# Patient Record
Sex: Female | Born: 1950 | Race: Black or African American | Hispanic: No | Marital: Married | Smoking: Never smoker
Health system: Southern US, Community
[De-identification: ages and names within clinical notes are randomized; demographics above are authoritative.]

## PROBLEM LIST (undated history)

## (undated) DIAGNOSIS — M109 Gout, unspecified: Secondary | ICD-10-CM

## (undated) DIAGNOSIS — I10 Essential (primary) hypertension: Secondary | ICD-10-CM

## (undated) DIAGNOSIS — L930 Discoid lupus erythematosus: Secondary | ICD-10-CM

## (undated) DIAGNOSIS — R7303 Prediabetes: Secondary | ICD-10-CM

## (undated) DIAGNOSIS — I7300 Raynaud's syndrome without gangrene: Secondary | ICD-10-CM

## (undated) HISTORY — PX: APPENDECTOMY: SHX54

## (undated) HISTORY — PX: TONSILLECTOMY: SUR1361

## (undated) HISTORY — PX: HERNIA REPAIR: SHX51

---

## 2014-08-21 DIAGNOSIS — L508 Other urticaria: Secondary | ICD-10-CM | POA: Insufficient documentation

## 2014-08-21 DIAGNOSIS — E559 Vitamin D deficiency, unspecified: Secondary | ICD-10-CM | POA: Insufficient documentation

## 2014-08-21 DIAGNOSIS — I10 Essential (primary) hypertension: Secondary | ICD-10-CM | POA: Insufficient documentation

## 2014-09-25 DIAGNOSIS — Z531 Procedure and treatment not carried out because of patient's decision for reasons of belief and group pressure: Secondary | ICD-10-CM | POA: Insufficient documentation

## 2014-10-19 DIAGNOSIS — J3089 Other allergic rhinitis: Secondary | ICD-10-CM | POA: Insufficient documentation

## 2015-02-02 DIAGNOSIS — R0683 Snoring: Secondary | ICD-10-CM | POA: Insufficient documentation

## 2015-03-04 ENCOUNTER — Ambulatory Visit: Payer: BLUE CROSS/BLUE SHIELD | Attending: Otolaryngology

## 2015-03-04 DIAGNOSIS — R0683 Snoring: Secondary | ICD-10-CM | POA: Insufficient documentation

## 2015-03-04 DIAGNOSIS — G4733 Obstructive sleep apnea (adult) (pediatric): Secondary | ICD-10-CM | POA: Diagnosis not present

## 2015-04-08 ENCOUNTER — Ambulatory Visit: Payer: BLUE CROSS/BLUE SHIELD | Attending: Otolaryngology

## 2015-04-08 DIAGNOSIS — R0683 Snoring: Secondary | ICD-10-CM | POA: Diagnosis present

## 2015-04-08 DIAGNOSIS — G4733 Obstructive sleep apnea (adult) (pediatric): Secondary | ICD-10-CM | POA: Insufficient documentation

## 2015-06-21 DIAGNOSIS — Z6834 Body mass index (BMI) 34.0-34.9, adult: Secondary | ICD-10-CM | POA: Insufficient documentation

## 2015-06-21 DIAGNOSIS — G4733 Obstructive sleep apnea (adult) (pediatric): Secondary | ICD-10-CM | POA: Insufficient documentation

## 2015-09-14 DIAGNOSIS — Z780 Asymptomatic menopausal state: Secondary | ICD-10-CM | POA: Insufficient documentation

## 2015-11-01 ENCOUNTER — Other Ambulatory Visit: Payer: Self-pay | Admitting: Internal Medicine

## 2015-11-01 DIAGNOSIS — Z1231 Encounter for screening mammogram for malignant neoplasm of breast: Secondary | ICD-10-CM

## 2015-11-08 ENCOUNTER — Ambulatory Visit
Admission: RE | Admit: 2015-11-08 | Discharge: 2015-11-08 | Disposition: A | Discharge status: Home / Self Care | Payer: Medicare Other | Source: Ambulatory Visit | Attending: Internal Medicine | Admitting: Internal Medicine

## 2015-11-08 DIAGNOSIS — Z1231 Encounter for screening mammogram for malignant neoplasm of breast: Secondary | ICD-10-CM | POA: Insufficient documentation

## 2016-01-17 DIAGNOSIS — R7303 Prediabetes: Secondary | ICD-10-CM | POA: Insufficient documentation

## 2016-09-11 ENCOUNTER — Other Ambulatory Visit: Payer: Self-pay | Admitting: Internal Medicine

## 2016-09-11 DIAGNOSIS — Z1231 Encounter for screening mammogram for malignant neoplasm of breast: Secondary | ICD-10-CM

## 2016-11-09 ENCOUNTER — Ambulatory Visit: Payer: BLUE CROSS/BLUE SHIELD

## 2016-11-10 ENCOUNTER — Ambulatory Visit
Admission: RE | Admit: 2016-11-10 | Discharge: 2016-11-10 | Disposition: A | Discharge status: Home / Self Care | Payer: Medicare Other | Source: Ambulatory Visit | Attending: Internal Medicine | Admitting: Internal Medicine

## 2016-11-10 DIAGNOSIS — Z1231 Encounter for screening mammogram for malignant neoplasm of breast: Secondary | ICD-10-CM

## 2017-12-17 ENCOUNTER — Other Ambulatory Visit: Payer: Self-pay | Admitting: Internal Medicine

## 2017-12-17 DIAGNOSIS — Z1231 Encounter for screening mammogram for malignant neoplasm of breast: Secondary | ICD-10-CM

## 2017-12-24 ENCOUNTER — Ambulatory Visit
Admission: RE | Admit: 2017-12-24 | Discharge: 2017-12-24 | Disposition: A | Discharge status: Home / Self Care | Payer: Medicare Other | Source: Ambulatory Visit | Attending: Internal Medicine | Admitting: Internal Medicine

## 2017-12-24 DIAGNOSIS — Z1231 Encounter for screening mammogram for malignant neoplasm of breast: Secondary | ICD-10-CM | POA: Diagnosis not present

## 2018-07-05 DIAGNOSIS — R3129 Other microscopic hematuria: Secondary | ICD-10-CM | POA: Insufficient documentation

## 2018-12-19 ENCOUNTER — Other Ambulatory Visit: Payer: Self-pay | Admitting: Internal Medicine

## 2018-12-19 DIAGNOSIS — Z1231 Encounter for screening mammogram for malignant neoplasm of breast: Secondary | ICD-10-CM

## 2019-07-23 ENCOUNTER — Other Ambulatory Visit: Payer: Self-pay | Admitting: Infectious Diseases

## 2019-07-23 DIAGNOSIS — Z1231 Encounter for screening mammogram for malignant neoplasm of breast: Secondary | ICD-10-CM

## 2019-08-25 ENCOUNTER — Other Ambulatory Visit: Payer: Self-pay

## 2019-08-25 DIAGNOSIS — Z20822 Contact with and (suspected) exposure to covid-19: Secondary | ICD-10-CM

## 2019-08-27 LAB — NOVEL CORONAVIRUS, NAA: SARS-CoV-2, NAA: NOT DETECTED

## 2019-10-20 ENCOUNTER — Ambulatory Visit
Admission: RE | Admit: 2019-10-20 | Discharge: 2019-10-20 | Disposition: A | Discharge status: Home / Self Care | Payer: Medicare Other | Source: Ambulatory Visit | Attending: Infectious Diseases | Admitting: Infectious Diseases

## 2019-10-20 DIAGNOSIS — Z1231 Encounter for screening mammogram for malignant neoplasm of breast: Secondary | ICD-10-CM | POA: Diagnosis present

## 2020-09-20 ENCOUNTER — Other Ambulatory Visit: Payer: Self-pay | Admitting: Infectious Diseases

## 2020-09-20 DIAGNOSIS — Z1231 Encounter for screening mammogram for malignant neoplasm of breast: Secondary | ICD-10-CM

## 2020-11-02 ENCOUNTER — Other Ambulatory Visit: Payer: Self-pay

## 2020-11-02 ENCOUNTER — Ambulatory Visit
Admission: RE | Admit: 2020-11-02 | Discharge: 2020-11-02 | Disposition: A | Discharge status: Home / Self Care | Payer: Medicare Other | Source: Ambulatory Visit | Attending: Infectious Diseases | Admitting: Infectious Diseases

## 2020-11-02 DIAGNOSIS — Z1231 Encounter for screening mammogram for malignant neoplasm of breast: Secondary | ICD-10-CM | POA: Diagnosis not present

## 2020-11-10 ENCOUNTER — Ambulatory Visit: Payer: Medicare Other | Admitting: Podiatry

## 2020-11-22 ENCOUNTER — Ambulatory Visit: Payer: Medicare Other | Admitting: Podiatry

## 2020-12-20 ENCOUNTER — Ambulatory Visit (INDEPENDENT_AMBULATORY_CARE_PROVIDER_SITE_OTHER): Payer: Medicare Other | Admitting: Podiatry

## 2020-12-20 ENCOUNTER — Encounter: Payer: Self-pay | Admitting: Podiatry

## 2020-12-20 ENCOUNTER — Other Ambulatory Visit: Payer: Self-pay

## 2020-12-20 DIAGNOSIS — B351 Tinea unguium: Secondary | ICD-10-CM

## 2020-12-20 DIAGNOSIS — D2371 Other benign neoplasm of skin of right lower limb, including hip: Secondary | ICD-10-CM

## 2020-12-20 DIAGNOSIS — D2372 Other benign neoplasm of skin of left lower limb, including hip: Secondary | ICD-10-CM

## 2020-12-20 DIAGNOSIS — M79676 Pain in unspecified toe(s): Secondary | ICD-10-CM | POA: Diagnosis not present

## 2020-12-20 NOTE — Progress Notes (Signed)
  Subjective:  Patient ID: Regina Frazier, female    DOB: Jul 11, 1951,  MRN: [id number] HPI Chief Complaint  Patient presents with  . Diabetes    Pre-diabetic - check feet - requesting nail care, callused areas, corns   . New Patient (Initial Visit)    70 y.o. female presents with the above complaint.   ROS: Denies fever chills nausea vomiting muscle aches pains calf pain back pain chest pain shortness of breath  History of Raynaud's hammertoe repair over 20 years ago bilateral  No past medical history on file. No past surgical history on file.  Current Outpatient Medications:  .  hydrochlorothiazide (HYDRODIURIL) 25 MG tablet, Take 25 mg by mouth daily., Disp: , Rfl:  .  levocetirizine (XYZAL) 5 MG tablet, SMARTSIG:1 Tablet(s) By Mouth Every Evening, Disp: , Rfl:   Allergies  Allergen Reactions  . Clindamycin     Other reaction(s): Other (See Comments) GI upset  . Penicillins     Other reaction(s): Other (See Comments), Other (See Comments) unsure As a child, GI upset   . Aspirin Nausea Only    Other reaction(s): Other (See Comments) GI upset   . Latex Rash   Review of Systems Objective:  There were no vitals filed for this visit.  General: Well developed, nourished, in no acute distress, alert and oriented x3   Dermatological: Skin is warm, dry and supple bilateral. Nails x 10 are well maintained; remaining integument appears unremarkable at this time. There are no open sores, no preulcerative lesions, no rash or signs of infection present.  She has areas of xerosis and areas of multiple benign hyperkeratotic lesions.  Vascular: Dorsalis Pedis artery and Posterior Tibial artery pedal pulses are 2/4 bilateral with immedate capillary fill time. Pedal hair growth present. No varicosities and no lower extremity edema present bilateral.   Neruologic: Grossly intact via light touch bilateral. Vibratory intact via tuning fork bilateral. Protective threshold with Semmes  Wienstein monofilament intact to all pedal sites bilateral. Patellar and Achilles deep tendon reflexes 2+ bilateral. No Babinski or clonus noted bilateral.   Musculoskeletal: No gross boney pedal deformities bilateral. No pain, crepitus, or limitation noted with foot and ankle range of motion bilateral. Muscular strength 5/5 in all groups tested bilateral.  Gait: Unassisted, Nonantalgic.    Radiographs:  None taken  Assessment & Plan:   Assessment: Hammertoes 2 through 5 bilateral benign skin lesions bilateral diabetes with early diabetic peripheral neuropathy and painful mycotic nails.  Plan: Debridement of toenails 1 through 5 bilateral.  Debridement of all reactive hyperkeratosis bilateral.  Perform diabetic exam.  She will follow-up with Dr. Sharyon Cable for routine nail debridement.     Ahmaud Duthie T. Bell, [place]

## 2021-03-28 ENCOUNTER — Other Ambulatory Visit: Payer: Self-pay

## 2021-03-28 ENCOUNTER — Encounter: Payer: Self-pay | Admitting: Podiatry

## 2021-03-28 ENCOUNTER — Ambulatory Visit (INDEPENDENT_AMBULATORY_CARE_PROVIDER_SITE_OTHER): Payer: Medicare Other | Admitting: Podiatry

## 2021-03-28 DIAGNOSIS — B351 Tinea unguium: Secondary | ICD-10-CM | POA: Diagnosis not present

## 2021-03-28 DIAGNOSIS — L84 Corns and callosities: Secondary | ICD-10-CM | POA: Diagnosis not present

## 2021-03-28 DIAGNOSIS — M2010 Hallux valgus (acquired), unspecified foot: Secondary | ICD-10-CM

## 2021-03-28 DIAGNOSIS — M79676 Pain in unspecified toe(s): Secondary | ICD-10-CM

## 2021-03-28 DIAGNOSIS — M2042 Other hammer toe(s) (acquired), left foot: Secondary | ICD-10-CM

## 2021-03-28 NOTE — Progress Notes (Signed)
This patient presents to the office with chief complaint of long thick painful nails.  Patient says the nails are painful walking and wearing shoes.  This patient also has painful callus both feet expecially her second toe left foot. This patient is unable to trim her nails since she is unable to reach her nails.  She has history of gout both big toes which are improving. She presents to the office for preventative foot care services.  General Appearance  Alert, conversant and in no acute stress.  Vascular  Dorsalis pedis and posterior tibial  pulses are palpable  bilaterally.  Capillary return is within normal limits  bilaterally. Temperature is within normal limits  bilaterally.  Neurologic  Senn-Weinstein monofilament wire test within normal limits  bilaterally. Muscle power within normal limits bilaterally.  Nails Thick disfigured discolored nails with subungual debris  from hallux to fifth toes bilaterally. No evidence of bacterial infection or drainage bilaterally.  Orthopedic  No limitations of motion  feet .  No crepitus or effusions noted.  HAV  B/L.  Hammer toe second toe left foot.  Skin  normotropic skin with noted bilaterally.  No signs of infections or ulcers noted.   Multiple callus sub metatarsals.  Corn second toe left foot due to severe 1/2 positioning.  Onychomycosis  Nails  B/L.  Pain in right toes  Pain in left toes  Corn second toe left foot.  Debridement of nails both feet followed trimming the nails with dremel tool.  Debride corn with # 15 blade.  RTC 3 months.   Gardiner Barefoot DPM

## 2021-07-14 ENCOUNTER — Ambulatory Visit: Payer: Medicare Other | Admitting: Podiatry

## 2021-11-07 ENCOUNTER — Encounter: Payer: Self-pay | Admitting: Emergency Medicine

## 2021-11-07 ENCOUNTER — Other Ambulatory Visit: Payer: Self-pay

## 2021-11-07 ENCOUNTER — Ambulatory Visit
Admission: EM | Admit: 2021-11-07 | Discharge: 2021-11-07 | Disposition: A | Discharge status: Home / Self Care | Payer: Medicare Other | Attending: Physician Assistant | Admitting: Physician Assistant

## 2021-11-07 DIAGNOSIS — Z1152 Encounter for screening for COVID-19: Secondary | ICD-10-CM | POA: Diagnosis not present

## 2021-11-07 NOTE — ED Triage Notes (Addendum)
Pt her for COVID test due to exposure. No sxs

## 2021-11-08 LAB — SARS-COV-2, NAA 2 DAY TAT

## 2021-11-08 LAB — NOVEL CORONAVIRUS, NAA: SARS-CoV-2, NAA: NOT DETECTED

## 2021-12-01 ENCOUNTER — Other Ambulatory Visit: Payer: Self-pay | Admitting: Infectious Diseases

## 2021-12-01 DIAGNOSIS — Z1231 Encounter for screening mammogram for malignant neoplasm of breast: Secondary | ICD-10-CM

## 2022-01-19 ENCOUNTER — Ambulatory Visit: Payer: Medicare Other

## 2022-01-25 ENCOUNTER — Ambulatory Visit
Admission: RE | Admit: 2022-01-25 | Discharge: 2022-01-25 | Disposition: A | Discharge status: Home / Self Care | Payer: Medicare Other | Source: Ambulatory Visit | Attending: Infectious Diseases | Admitting: Infectious Diseases

## 2022-01-25 DIAGNOSIS — Z1231 Encounter for screening mammogram for malignant neoplasm of breast: Secondary | ICD-10-CM | POA: Insufficient documentation

## 2022-08-17 ENCOUNTER — Ambulatory Visit: Payer: Medicare Other

## 2022-10-09 IMAGING — MG MM DIGITAL SCREENING BILAT W/ TOMO AND CAD
8 series · 8 of 24 positions shown · non-contrast
Comparison: Previous exam(s).

CLINICAL DATA: Screening.

EXAM:
DIGITAL SCREENING BILATERAL MAMMOGRAM WITH TOMOSYNTHESIS AND CAD
TECHNIQUE: Bilateral screening digital craniocaudal and mediolateral oblique
mammograms were obtained. Bilateral screening digital breast
tomosynthesis was performed. The images were evaluated with
computer-aided detection.

[R CC synth-2D]
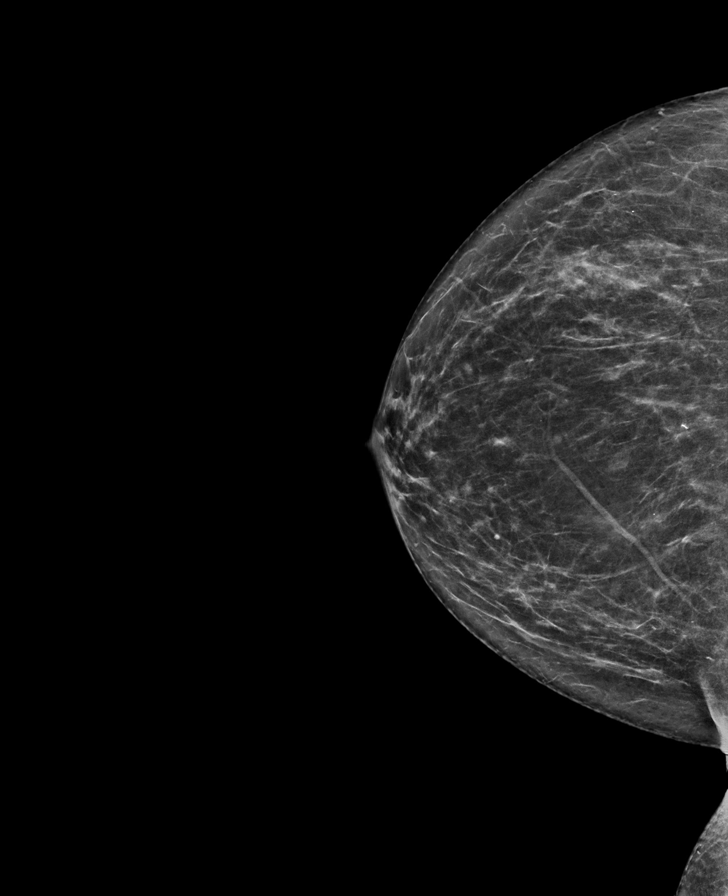

[L MLO synth-2D]
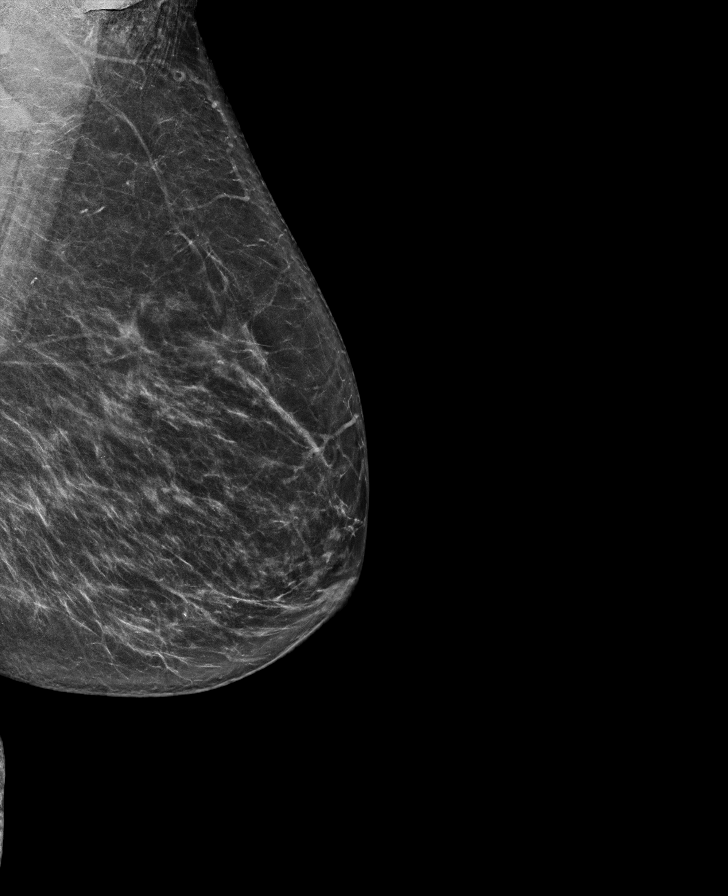

[L CC synth-2D]
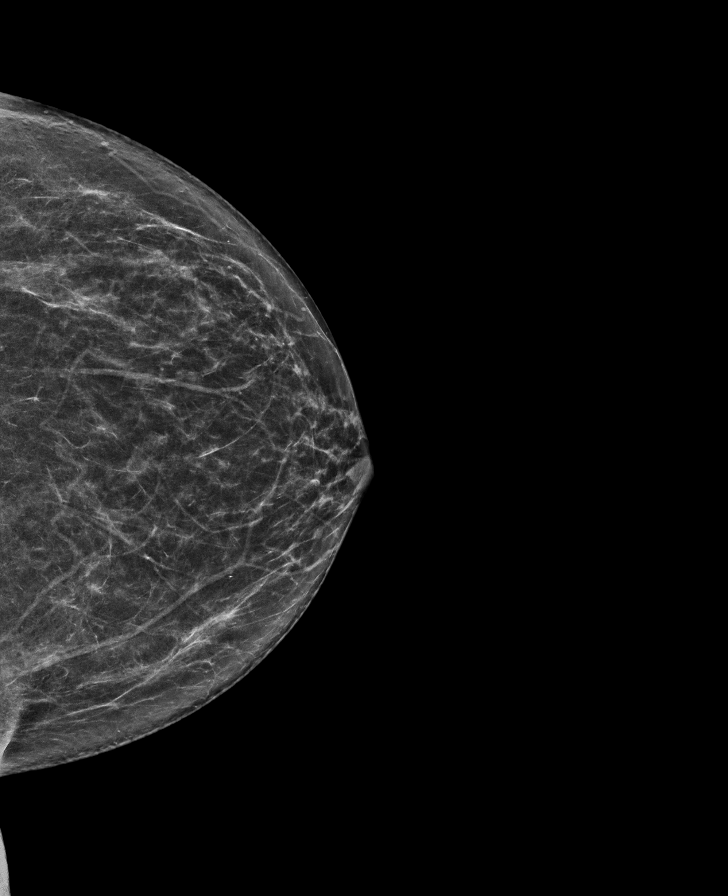

[R MLO synth-2D]
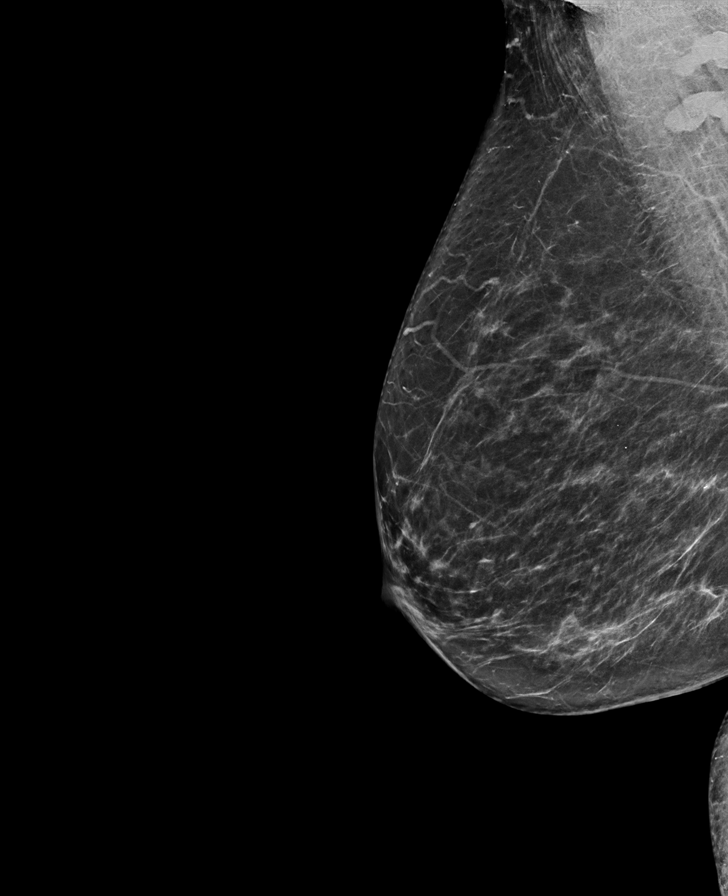

[L CC tomo · tomo slice 35/69.0]
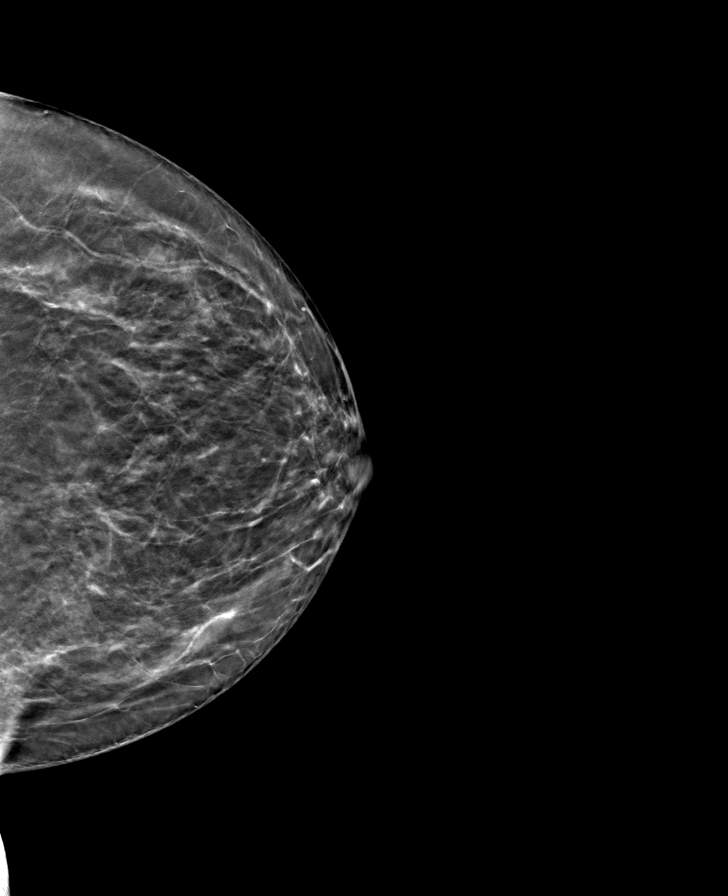

[R CC tomo · tomo slice 36/71.0]
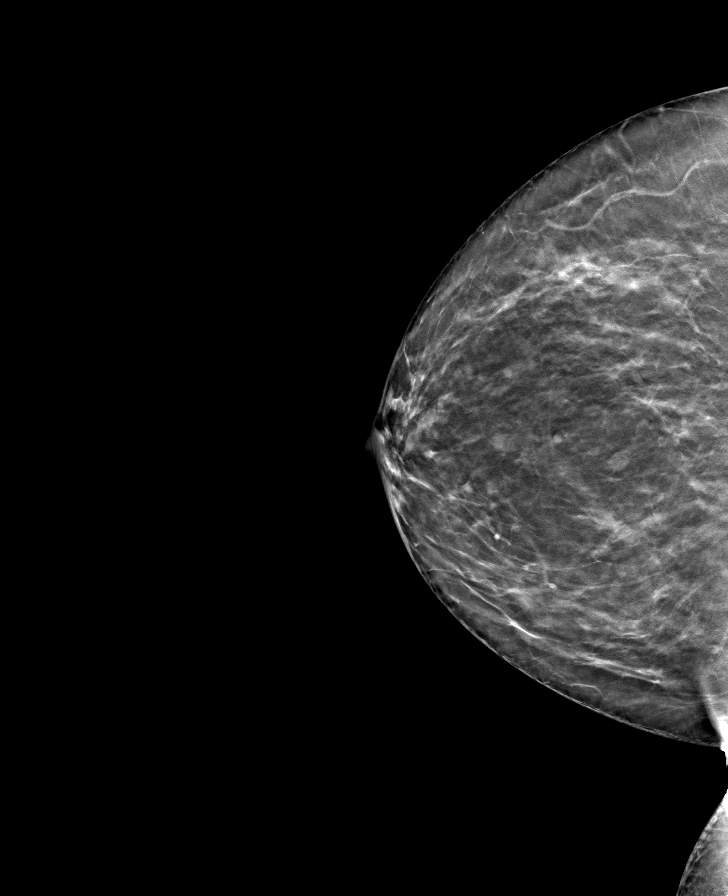

[R MLO tomo · tomo slice 39/77.0]
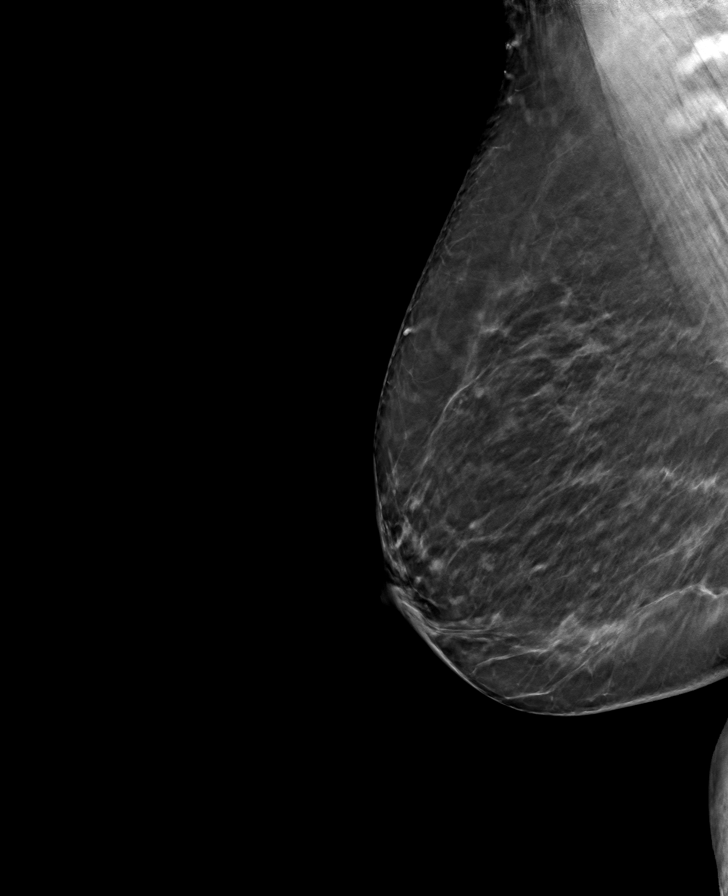

[L MLO tomo · tomo slice 37/72.0]
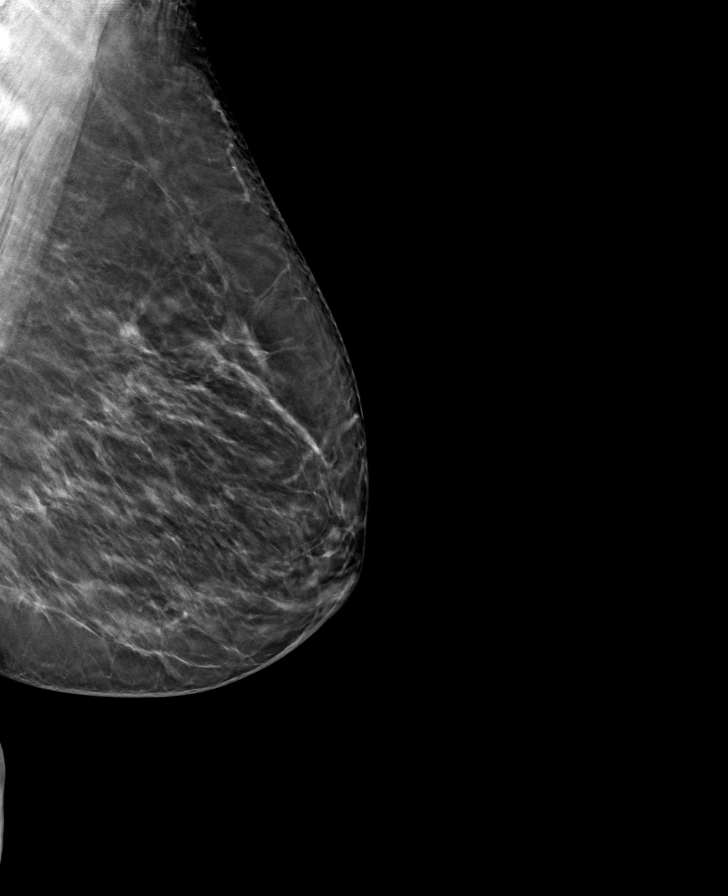

[8 of 24 positions shown; findings below may reference images not displayed]

ACR Breast Density Category b: There are scattered areas of
fibroglandular density.
FINDINGS: There are no findings suspicious for malignancy.
IMPRESSION: No mammographic evidence of malignancy. A result letter of this
screening mammogram will be mailed directly to the patient.

RECOMMENDATION:
Screening mammogram in one year. (Code:51-O-LD2)

BI-RADS CATEGORY  1: Negative.

## 2022-12-07 ENCOUNTER — Other Ambulatory Visit: Payer: Self-pay | Admitting: Infectious Diseases

## 2022-12-07 DIAGNOSIS — Z1231 Encounter for screening mammogram for malignant neoplasm of breast: Secondary | ICD-10-CM

## 2023-01-31 ENCOUNTER — Ambulatory Visit
Admission: RE | Admit: 2023-01-31 | Discharge: 2023-01-31 | Disposition: A | Discharge status: Home / Self Care | Payer: Medicare Other | Source: Ambulatory Visit | Attending: Infectious Diseases | Admitting: Infectious Diseases

## 2023-01-31 DIAGNOSIS — Z1231 Encounter for screening mammogram for malignant neoplasm of breast: Secondary | ICD-10-CM | POA: Insufficient documentation

## 2023-04-13 ENCOUNTER — Ambulatory Visit: Payer: Medicare Other

## 2023-04-13 DIAGNOSIS — Z719 Counseling, unspecified: Secondary | ICD-10-CM

## 2023-04-13 DIAGNOSIS — Z23 Encounter for immunization: Secondary | ICD-10-CM | POA: Diagnosis not present

## 2023-04-13 NOTE — Progress Notes (Signed)
Pt seen in nurse clinic requested covid 19 Pfizer accompanied by husband. Reviewed NCIR, eligible, administered Comirnaty 12y+, year 2023-2024, monitored for 15 minutes without problems. Given VIS and NCIR copy, explained and understood. M.Mackayla Mullins, LPN.

## 2023-10-29 ENCOUNTER — Encounter: Payer: Self-pay | Admitting: Ophthalmology

## 2023-11-05 NOTE — Discharge Instructions (Signed)

## 2023-11-07 ENCOUNTER — Ambulatory Visit: Payer: Medicare Other | Admitting: Anesthesiology

## 2023-11-07 ENCOUNTER — Encounter
Admission: RE | Disposition: A | Discharge status: Home / Self Care | Payer: Self-pay | Source: Home / Self Care | Attending: Ophthalmology

## 2023-11-07 ENCOUNTER — Ambulatory Visit
Admission: RE | Admit: 2023-11-07 | Discharge: 2023-11-07 | Disposition: A | Discharge status: Home / Self Care | Payer: Medicare Other | Attending: Ophthalmology | Admitting: Ophthalmology

## 2023-11-07 ENCOUNTER — Other Ambulatory Visit: Payer: Self-pay

## 2023-11-07 ENCOUNTER — Encounter: Payer: Self-pay | Admitting: Ophthalmology

## 2023-11-07 DIAGNOSIS — G4730 Sleep apnea, unspecified: Secondary | ICD-10-CM | POA: Insufficient documentation

## 2023-11-07 DIAGNOSIS — Z87891 Personal history of nicotine dependence: Secondary | ICD-10-CM | POA: Insufficient documentation

## 2023-11-07 DIAGNOSIS — I7300 Raynaud's syndrome without gangrene: Secondary | ICD-10-CM | POA: Diagnosis not present

## 2023-11-07 DIAGNOSIS — L930 Discoid lupus erythematosus: Secondary | ICD-10-CM | POA: Insufficient documentation

## 2023-11-07 DIAGNOSIS — I10 Essential (primary) hypertension: Secondary | ICD-10-CM | POA: Diagnosis not present

## 2023-11-07 DIAGNOSIS — H2511 Age-related nuclear cataract, right eye: Secondary | ICD-10-CM | POA: Diagnosis present

## 2023-11-07 HISTORY — DX: Raynaud's syndrome without gangrene: I73.00

## 2023-11-07 HISTORY — DX: Essential (primary) hypertension: I10

## 2023-11-07 HISTORY — DX: Discoid lupus erythematosus: L93.0

## 2023-11-07 HISTORY — DX: Prediabetes: R73.03

## 2023-11-07 HISTORY — PX: CATARACT EXTRACTION W/PHACO: SHX586

## 2023-11-07 HISTORY — DX: Gout, unspecified: M10.9

## 2023-11-07 SURGERY — PHACOEMULSIFICATION, CATARACT, WITH IOL INSERTION
Anesthesia: Monitor Anesthesia Care | Site: Eye | Laterality: Right

## 2023-11-07 MED ORDER — SIGHTPATH DOSE#1 BSS IO SOLN
INTRAOCULAR | Status: DC | PRN
  Administered 2023-11-07: 15 mL via INTRAOCULAR

## 2023-11-07 MED ORDER — FENTANYL CITRATE (PF) 100 MCG/2ML IJ SOLN
INTRAMUSCULAR | Status: DC | PRN
Start: 2023-11-07 — End: 2023-11-07
  Administered 2023-11-07: 50 ug via INTRAVENOUS

## 2023-11-07 MED ORDER — SIGHTPATH DOSE#1 BSS IO SOLN
INTRAOCULAR | Status: DC | PRN
  Administered 2023-11-07: 60 mL via OPHTHALMIC

## 2023-11-07 MED ORDER — TETRACAINE HCL 0.5 % OP SOLN
1.0000 [drp] | OPHTHALMIC | Status: DC | PRN
  Administered 2023-11-07 (×3): 1 [drp] via OPHTHALMIC

## 2023-11-07 MED ORDER — TETRACAINE HCL 0.5 % OP SOLN
OPHTHALMIC | Status: AC
  Filled 2023-11-07: qty 4

## 2023-11-07 MED ORDER — ARMC OPHTHALMIC DILATING DROPS
1.0000 | OPHTHALMIC | Status: DC | PRN
Start: 2023-11-07 — End: 2023-11-07
  Administered 2023-11-07 (×3): 1 via OPHTHALMIC

## 2023-11-07 MED ORDER — SIGHTPATH DOSE#1 NA HYALUR & NA CHOND-NA HYALUR IO KIT
PACK | INTRAOCULAR | Status: DC | PRN
  Administered 2023-11-07: 1 via OPHTHALMIC

## 2023-11-07 MED ORDER — DIPHENHYDRAMINE HCL 50 MG/ML IJ SOLN
25.0000 mg | Freq: Once | INTRAMUSCULAR | Status: AC
  Administered 2023-11-07: 25 mg via INTRAVENOUS

## 2023-11-07 MED ORDER — MIDAZOLAM HCL 2 MG/2ML IJ SOLN
INTRAMUSCULAR | Status: AC
  Filled 2023-11-07: qty 2

## 2023-11-07 MED ORDER — BRIMONIDINE TARTRATE-TIMOLOL 0.2-0.5 % OP SOLN
OPHTHALMIC | Status: DC | PRN
  Administered 2023-11-07: 1 [drp] via OPHTHALMIC

## 2023-11-07 MED ORDER — MIDAZOLAM HCL 5 MG/5ML IJ SOLN
INTRAMUSCULAR | Status: DC | PRN
  Administered 2023-11-07: 1 mg via INTRAVENOUS

## 2023-11-07 MED ORDER — CEFUROXIME OPHTHALMIC INJECTION 1 MG/0.1 ML
INJECTION | OPHTHALMIC | Status: DC | PRN
  Administered 2023-11-07: .1 mL via INTRACAMERAL

## 2023-11-07 MED ORDER — DIPHENHYDRAMINE HCL 50 MG/ML IJ SOLN
INTRAMUSCULAR | Status: AC
  Filled 2023-11-07: qty 1

## 2023-11-07 MED ORDER — FENTANYL CITRATE (PF) 100 MCG/2ML IJ SOLN
INTRAMUSCULAR | Status: AC
  Filled 2023-11-07: qty 2

## 2023-11-07 MED ORDER — SIGHTPATH DOSE#1 BSS IO SOLN
INTRAOCULAR | Status: DC | PRN
  Administered 2023-11-07: 2 mL

## 2023-11-07 MED ORDER — ARMC OPHTHALMIC DILATING DROPS
OPHTHALMIC | Status: AC
  Filled 2023-11-07: qty 0.5

## 2023-11-07 SURGICAL SUPPLY — 10 items
CANNULA ANT/CHMB 27G (MISCELLANEOUS) IMPLANT
CANNULA ANT/CHMB 27GA (MISCELLANEOUS)
CATARACT SUITE SIGHTPATH (MISCELLANEOUS) ×1
FEE CATARACT SUITE SIGHTPATH (MISCELLANEOUS) ×1 IMPLANT
GLOVE SRG 8 PF TXTR STRL LF DI (GLOVE) ×1 IMPLANT
GLOVE SURG ENC TEXT LTX SZ7.5 (GLOVE) ×1 IMPLANT
LENS IOL TECNIS EYHANCE 18.5 (Intraocular Lens) IMPLANT
NDL FILTER BLUNT 18X1 1/2 (NEEDLE) ×1 IMPLANT
NEEDLE FILTER BLUNT 18X1 1/2 (NEEDLE) ×1
SYR 3ML LL SCALE MARK (SYRINGE) ×1 IMPLANT

## 2023-11-07 NOTE — Anesthesia Postprocedure Evaluation (Signed)
Anesthesia Post Note  Patient: Regina Frazier  Procedure(s) Performed: CATARACT EXTRACTION PHACO AND INTRAOCULAR LENS PLACEMENT (IOC) RIGHT 3.65 00:32.0 (Right: Eye)  Patient location during evaluation: PACU Anesthesia Type: MAC Level of consciousness: awake and alert Pain management: pain level controlled Vital Signs Assessment: post-procedure vital signs reviewed and stable Respiratory status: spontaneous breathing, nonlabored ventilation, respiratory function stable and patient connected to nasal cannula oxygen Cardiovascular status: stable and blood pressure returned to baseline Postop Assessment: no apparent nausea or vomiting Anesthetic complications: no   No notable events documented.   Last Vitals:  Vitals:   11/07/23 1303 11/07/23 1312  BP: 112/72 105/64  Pulse: 94   Resp: 15 11  Temp: 36.5 C   SpO2: 98% 98%    Last Pain:  Vitals:   11/07/23 1312  TempSrc:   PainSc: 0-No pain                 Annise Boran C Iren Whipp

## 2023-11-07 NOTE — H&P (Signed)
[facility]   Primary Care Physician:  Mick Sell, MD Ophthalmologist: Dr. Lockie Mola  Pre-Procedure History & Physical: HPI:  Regina Frazier is a 73 y.o. female here for ophthalmic surgery.   Past Medical History:  Diagnosis Date   Gout    Hypertension    Lupus erythematosus    Pre-diabetes    Raynaud's syndrome     Past Surgical History:  Procedure Laterality Date   APPENDECTOMY     HERNIA REPAIR     x3   TONSILLECTOMY      Prior to Admission medications   Medication Sig Start Date End Date Taking? Authorizing Provider  allopurinol (ZYLOPRIM) 100 MG tablet Take by mouth. 03/10/21  Yes [provider]  ergocalciferol (VITAMIN D2) 1.25 MG (50000 UT) capsule Take 50,000 Units by mouth once a week.   Yes [provider]  folic acid (FOLVITE) 1 MG tablet Take 1 mg by mouth daily.   Yes [provider]  hydrochlorothiazide (HYDRODIURIL) 25 MG tablet Take 25 mg by mouth daily. 11/22/20  Yes [provider]  Hydroxychloroquine Sulfate 400 MG TABS Take by mouth daily.   Yes [provider]  levocetirizine (XYZAL) 5 MG tablet SMARTSIG:1 Tablet(s) By Mouth Every Evening 10/26/20  Yes [provider]  TURMERIC PO Take by mouth.   Yes [provider]  colchicine 0.6 MG tablet Take 2 tablets (1.2mg ) by mouth at first sign of gout flare followed by 1 tablet (0.6mg ) after 1 hour. (Max 1.8mg  within 1 hour) Patient not taking: Reported on 10/29/2023 03/10/21   [provider]    Allergies as of 09/19/2023 - Review Complete 04/13/2023  Allergen Reaction Noted   Clindamycin  11/01/2015   Penicillins  08/21/2014   Aspirin Nausea Only 08/21/2014   Latex Rash 02/26/2016    Family History  Problem Relation Age of Onset   Breast cancer Sister        3's    Social History   Socioeconomic History   Marital status: Married    Spouse name: Not on file   Number of children: Not on file   Years of  education: Not on file   Highest education level: Not on file  Occupational History   Not on file  Tobacco Use   Smoking status: Former    Current packs/day: 0.00    Average packs/day: 0.5 packs/day for 5.0 years (2.5 ttl pk-yrs)    Types: Cigarettes    Start date: 30    Quit date: 3    Years since quitting: 48.1   Smokeless tobacco: Never  Vaping Use   Vaping status: Never Used  Substance and Sexual Activity   Alcohol use: Yes    Comment: rare   Drug use: Not on file   Sexual activity: Not on file  Other Topics Concern   Not on file  Social History Narrative   Not on file   Social Drivers of Health   Financial Resource Strain: Low Risk  (12/06/2022)   Received from [facility], [facility]   Overall Financial Resource Strain (CARDIA)    Difficulty of Paying Living Expenses: Not hard at all  Food Insecurity: No Food Insecurity (12/06/2022)   Received from [facility], [facility]   Hunger Vital Sign    Worried About Running Out of Food in the Last Year: Never true    Ran Out of Food in the Last Year: Never true  Transportation Needs: No Transportation Needs (12/06/2022)   Received from [facility], [facility]   [facility] - Transportation    In the past 12 months, has lack of transportation kept you from medical appointments or from getting medications?: No    Lack of Transportation (Non-Medical): No  Physical Activity: Not on file  Stress: Not on file  Social Connections: Not on file  Intimate Partner Violence: Not on file    Review of Systems: See HPI, otherwise negative ROS  Physical Exam: BP 111/67   Pulse 79   Temp (!) 97.3 F (36.3 C) (Temporal)   Resp 20   Ht 5' (1.524 m)   Wt 75.8 kg   SpO2 97%   BMI 32.61 kg/m  General:   Alert,  pleasant and cooperative in NAD Head:  Normocephalic and atraumatic. Lungs:  Clear to auscultation.    Heart:   Regular rate and rhythm.   Impression/Plan: Regina Frazier is here for ophthalmic surgery.  Risks, benefits, limitations, and alternatives regarding ophthalmic surgery have been reviewed with the patient.  Questions have been answered.  All parties agreeable.   Lockie Mola, MD  11/07/2023, 11:42 AM

## 2023-11-07 NOTE — Op Note (Signed)
LOCATION:  [facility]   PREOPERATIVE DIAGNOSIS:    Nuclear sclerotic cataract right eye. H25.11   POSTOPERATIVE DIAGNOSIS:  Nuclear sclerotic cataract right eye.     PROCEDURE:  Phacoemusification with posterior chamber intraocular lens placement of the right eye   ULTRASOUND TIME: Procedure(s): CATARACT EXTRACTION PHACO AND INTRAOCULAR LENS PLACEMENT (IOC) RIGHT 3.65 00:32.0 (Right)  LENS:   Implant Name Type Inv. Item Serial No. Manufacturer Lot No. LRB No. Used Action  LENS IOL TECNIS EYHANCE 18.5 - [id number] Intraocular Lens LENS IOL TECNIS EYHANCE 18.5 [id number] [employer]  Right 1 Implanted         SURGEON:  Deirdre Evener, MD   ANESTHESIA:  Topical with tetracaine drops and 2% Xylocaine jelly, augmented with 1% preservative-free intracameral lidocaine.    COMPLICATIONS:  None.   DESCRIPTION OF PROCEDURE:  The patient was identified in the holding room and transported to the operating room and placed in the supine position under the operating microscope.  The right eye was identified as the operative eye and it was prepped and draped in the usual sterile ophthalmic fashion.   A 1 millimeter clear-corneal paracentesis was made at the 12:00 position.  0.5 ml of preservative-free 1% lidocaine was injected into the anterior chamber. The anterior chamber was filled with Viscoat viscoelastic.  A 2.4 millimeter keratome was used to make a near-clear corneal incision at the 9:00 position.  A curvilinear capsulorrhexis was made with a cystotome and capsulorrhexis forceps.  Balanced salt solution was used to hydrodissect and hydrodelineate the nucleus.   Phacoemulsification was then used in stop and chop fashion to remove the lens nucleus and epinucleus.  The remaining cortex was then removed using the irrigation and aspiration handpiece. Provisc was then placed into the capsular bag to distend it for lens placement.  A lens was then injected into the capsular bag.  The  remaining viscoelastic was aspirated.   Wounds were hydrated with balanced salt solution.  The anterior chamber was inflated to a physiologic pressure with balanced salt solution.  No wound leaks were noted. Cefuroxime 0.1 ml of a 10mg /ml solution was injected into the anterior chamber for a dose of 1 mg of intracameral antibiotic at the completion of the case.   Timolol and Brimonidine drops were applied to the eye.  The patient was taken to the recovery room in stable condition without complications of anesthesia or surgery.   Regina Frazier 11/07/2023, 1:00 PM

## 2023-11-07 NOTE — Transfer of Care (Signed)
Immediate Anesthesia Transfer of Care Note  Patient: Regina Frazier  Procedure(s) Performed: CATARACT EXTRACTION PHACO AND INTRAOCULAR LENS PLACEMENT (IOC) RIGHT 3.65 00:32.0 (Right: Eye)  Patient Location: PACU  Anesthesia Type: MAC  Level of Consciousness: awake, alert  and patient cooperative  Airway and Oxygen Therapy: Patient Spontanous Breathing and Patient connected to supplemental oxygen  Post-op Assessment: Post-op Vital signs reviewed, Patient's Cardiovascular Status Stable, Respiratory Function Stable, Patent Airway and No signs of Nausea or vomiting  Post-op Vital Signs: Reviewed and stable  Complications: No notable events documented.

## 2023-11-07 NOTE — Anesthesia Preprocedure Evaluation (Addendum)
Anesthesia Evaluation  Patient identified by MRN, date of birth, ID band Patient awake    Reviewed: Allergy & Precautions, H&P , NPO status , Patient's Chart, lab work & pertinent test results  Airway Mallampati: III  TM Distance: >3 FB Neck ROM: Full  Mouth opening: Limited Mouth Opening  Dental no notable dental hx.    Pulmonary neg pulmonary ROS, sleep apnea , former smoker   Pulmonary exam normal breath sounds clear to auscultation       Cardiovascular hypertension, negative cardio ROS Normal cardiovascular exam Rhythm:Regular Rate:Normal     Neuro/Psych negative neurological ROS  negative psych ROS   GI/Hepatic negative GI ROS, Neg liver ROS,,,  Endo/Other  negative endocrine ROS    Renal/GU negative Renal ROS  negative genitourinary   Musculoskeletal negative musculoskeletal ROS (+)    Abdominal   Peds negative pediatric ROS (+)  Hematology negative hematology ROS (+)   Anesthesia Other Findings Raynaud's syndrome --keep patient warm Lupus erythematosus Gout  Hypertension Pre-diabetes obesity     Reproductive/Obstetrics negative OB ROS                             Anesthesia Physical Anesthesia Plan  ASA: 2  Anesthesia Plan: MAC   Post-op Pain Management:    Induction: Intravenous  PONV Risk Score and Plan:   Airway Management Planned: Natural Airway and Nasal Cannula  Additional Equipment:   Intra-op Plan:   Post-operative Plan:   Informed Consent: I have reviewed the patients History and Physical, chart, labs and discussed the procedure including the risks, benefits and alternatives for the proposed anesthesia with the patient or authorized representative who has indicated his/her understanding and acceptance.     Dental Advisory Given  Plan Discussed with: Anesthesiologist, CRNA and Surgeon  Anesthesia Plan Comments: (Patient consented for risks of  anesthesia including but not limited to:  - adverse reactions to medications - damage to eyes, teeth, lips or other oral mucosa - nerve damage due to positioning  - sore throat or hoarseness - Damage to heart, brain, nerves, lungs, other parts of body or loss of life  Patient voiced understanding and assent.)        Anesthesia Quick Evaluation

## 2023-11-08 ENCOUNTER — Encounter: Payer: Self-pay | Admitting: Ophthalmology

## 2023-11-19 NOTE — Discharge Instructions (Signed)

## 2023-11-21 ENCOUNTER — Other Ambulatory Visit: Payer: Self-pay

## 2023-11-21 ENCOUNTER — Ambulatory Visit: Payer: Medicare Other | Admitting: Anesthesiology

## 2023-11-21 ENCOUNTER — Ambulatory Visit
Admission: RE | Admit: 2023-11-21 | Discharge: 2023-11-21 | Disposition: A | Discharge status: Home / Self Care | Payer: Medicare Other | Attending: Ophthalmology | Admitting: Ophthalmology

## 2023-11-21 ENCOUNTER — Encounter
Admission: RE | Disposition: A | Discharge status: Home / Self Care | Payer: Self-pay | Source: Home / Self Care | Attending: Ophthalmology

## 2023-11-21 ENCOUNTER — Encounter: Payer: Self-pay | Admitting: Ophthalmology

## 2023-11-21 DIAGNOSIS — H2512 Age-related nuclear cataract, left eye: Secondary | ICD-10-CM | POA: Insufficient documentation

## 2023-11-21 DIAGNOSIS — I10 Essential (primary) hypertension: Secondary | ICD-10-CM | POA: Insufficient documentation

## 2023-11-21 DIAGNOSIS — H25012 Cortical age-related cataract, left eye: Secondary | ICD-10-CM | POA: Insufficient documentation

## 2023-11-21 DIAGNOSIS — Z87891 Personal history of nicotine dependence: Secondary | ICD-10-CM | POA: Diagnosis not present

## 2023-11-21 HISTORY — PX: CATARACT EXTRACTION W/PHACO: SHX586

## 2023-11-21 SURGERY — PHACOEMULSIFICATION, CATARACT, WITH IOL INSERTION
Anesthesia: Monitor Anesthesia Care | Laterality: Left

## 2023-11-21 MED ORDER — FENTANYL CITRATE (PF) 100 MCG/2ML IJ SOLN
INTRAMUSCULAR | Status: AC
Start: 2023-11-21 — End: ?
  Filled 2023-11-21: qty 2

## 2023-11-21 MED ORDER — TETRACAINE HCL 0.5 % OP SOLN
1.0000 [drp] | OPHTHALMIC | Status: DC | PRN
  Administered 2023-11-21 (×3): 1 [drp] via OPHTHALMIC

## 2023-11-21 MED ORDER — MIDAZOLAM HCL 2 MG/2ML IJ SOLN
INTRAMUSCULAR | Status: AC
  Filled 2023-11-21: qty 2

## 2023-11-21 MED ORDER — MOXIFLOXACIN HCL 0.5 % OP SOLN
OPHTHALMIC | Status: DC | PRN
  Administered 2023-11-21: .2 mL via OPHTHALMIC

## 2023-11-21 MED ORDER — MIDAZOLAM HCL 2 MG/2ML IJ SOLN
INTRAMUSCULAR | Status: DC | PRN
  Administered 2023-11-21: 1 mg via INTRAVENOUS

## 2023-11-21 MED ORDER — SIGHTPATH DOSE#1 BSS IO SOLN
INTRAOCULAR | Status: DC | PRN
  Administered 2023-11-21: 64 mL via OPHTHALMIC

## 2023-11-21 MED ORDER — SIGHTPATH DOSE#1 BSS IO SOLN
INTRAOCULAR | Status: DC | PRN
  Administered 2023-11-21: 1 mL

## 2023-11-21 MED ORDER — SIGHTPATH DOSE#1 BSS IO SOLN
INTRAOCULAR | Status: DC | PRN
  Administered 2023-11-21: 15 mL via INTRAOCULAR

## 2023-11-21 MED ORDER — FENTANYL CITRATE (PF) 100 MCG/2ML IJ SOLN
INTRAMUSCULAR | Status: DC | PRN
  Administered 2023-11-21: 50 ug via INTRAVENOUS

## 2023-11-21 MED ORDER — TETRACAINE HCL 0.5 % OP SOLN
OPHTHALMIC | Status: AC
  Filled 2023-11-21: qty 4

## 2023-11-21 MED ORDER — ARMC OPHTHALMIC DILATING DROPS
1.0000 | OPHTHALMIC | Status: DC | PRN
Start: 2023-11-21 — End: 2023-11-21
  Administered 2023-11-21 (×3): 1 via OPHTHALMIC

## 2023-11-21 MED ORDER — ARMC OPHTHALMIC DILATING DROPS
OPHTHALMIC | Status: AC
  Filled 2023-11-21: qty 0.5

## 2023-11-21 MED ORDER — SIGHTPATH DOSE#1 NA HYALUR & NA CHOND-NA HYALUR IO KIT
PACK | INTRAOCULAR | Status: DC | PRN
  Administered 2023-11-21: 1 via OPHTHALMIC

## 2023-11-21 SURGICAL SUPPLY — 10 items
CATARACT SUITE SIGHTPATH (MISCELLANEOUS) ×1 IMPLANT
FEE CATARACT SUITE SIGHTPATH (MISCELLANEOUS) ×1 IMPLANT
GLOVE SRG 8 PF TXTR STRL LF DI (GLOVE) ×1 IMPLANT
GLOVE SURG ENC TEXT LTX SZ7.5 (GLOVE) ×1 IMPLANT
GLOVE SURG GAMMEX PI TX LF 7.5 (GLOVE) IMPLANT
LENS IOL TECNIS EYHANCE 20.0 (Intraocular Lens) IMPLANT
NDL FILTER BLUNT 18X1 1/2 (NEEDLE) ×1 IMPLANT
NEEDLE FILTER BLUNT 18X1 1/2 (NEEDLE) ×1 IMPLANT
PACK VIT ANT 23G (MISCELLANEOUS) IMPLANT
SYR 3ML LL SCALE MARK (SYRINGE) ×1 IMPLANT

## 2023-11-21 NOTE — Transfer of Care (Signed)
Immediate Anesthesia Transfer of Care Note  Patient: Regina Frazier  Procedure(s) Performed: CATARACT EXTRACTION PHACO AND INTRAOCULAR LENS PLACEMENT (IOC) LEFT 5.63, 00:28.3 (Left)  Patient Location: PACU  Anesthesia Type: MAC  Level of Consciousness: awake, alert  and patient cooperative  Airway and Oxygen Therapy: Patient Spontanous Breathing and Patient connected to supplemental oxygen  Post-op Assessment: Post-op Vital signs reviewed, Patient's Cardiovascular Status Stable, Respiratory Function Stable, Patent Airway and No signs of Nausea or vomiting  Post-op Vital Signs: Reviewed and stable  Complications: No notable events documented.

## 2023-11-21 NOTE — H&P (Signed)
[facility]   Primary Care Physician:  Mick Sell, MD Ophthalmologist: Dr. Lockie Mola  Pre-Procedure History & Physical: HPI:  Regina Frazier is a 73 y.o. female here for ophthalmic surgery.   Past Medical History:  Diagnosis Date   Gout    Hypertension    Lupus erythematosus    Pre-diabetes    Raynaud's syndrome     Past Surgical History:  Procedure Laterality Date   APPENDECTOMY     CATARACT EXTRACTION W/PHACO Right 11/07/2023   Procedure: CATARACT EXTRACTION PHACO AND INTRAOCULAR LENS PLACEMENT (IOC) RIGHT 3.65 00:32.0;  Surgeon: Lockie Mola, MD;  [facility];  Service: Ophthalmology;  Laterality: Right;   HERNIA REPAIR     x3   TONSILLECTOMY      Prior to Admission medications   Medication Sig Start Date End Date Taking? Authorizing Provider  allopurinol (ZYLOPRIM) 100 MG tablet Take by mouth. 03/10/21  Yes [provider]  ergocalciferol (VITAMIN D2) 1.25 MG (50000 UT) capsule Take 50,000 Units by mouth once a week.   Yes [provider]  folic acid (FOLVITE) 1 MG tablet Take 1 mg by mouth daily.   Yes [provider]  hydrochlorothiazide (HYDRODIURIL) 25 MG tablet Take 25 mg by mouth daily. 11/22/20  Yes [provider]  Hydroxychloroquine Sulfate 400 MG TABS Take by mouth daily.   Yes [provider]  levocetirizine (XYZAL) 5 MG tablet SMARTSIG:1 Tablet(s) By Mouth Every Evening 10/26/20  Yes [provider]  TURMERIC PO Take by mouth.   Yes [provider]  colchicine 0.6 MG tablet Take 2 tablets (1.2mg ) by mouth at first sign of gout flare followed by 1 tablet (0.6mg ) after 1 hour. (Max 1.8mg  within 1 hour) Patient not taking: Reported on 10/29/2023 03/10/21   [provider]    Allergies as of 09/19/2023 - Review Complete 04/13/2023  Allergen Reaction Noted   Clindamycin  11/01/2015   Penicillins  08/21/2014   Aspirin Nausea Only 08/21/2014   Latex  Rash 02/26/2016    Family History  Problem Relation Age of Onset   Breast cancer Sister        47's    Social History   Socioeconomic History   Marital status: Married    Spouse name: Not on file   Number of children: Not on file   Years of education: Not on file   Highest education level: Not on file  Occupational History   Not on file  Tobacco Use   Smoking status: Former    Current packs/day: 0.00    Average packs/day: 0.5 packs/day for 5.0 years (2.5 ttl pk-yrs)    Types: Cigarettes    Start date: 46    Quit date: 97    Years since quitting: 48.1   Smokeless tobacco: Never  Vaping Use   Vaping status: Never Used  Substance and Sexual Activity   Alcohol use: Yes    Comment: rare   Drug use: Not on file   Sexual activity: Not on file  Other Topics Concern   Not on file  Social History Narrative   Not on file   Social Drivers of Health   Financial Resource Strain: Low Risk  (12/06/2022)   Received from [facility], [facility]   Overall Financial Resource Strain (CARDIA)    Difficulty of Paying Living Expenses: Not hard at all  Food Insecurity: No Food Insecurity (12/06/2022)   Received from [facility],  [facility]   Hunger Vital Sign    Worried About Running Out of Food in the Last Year: Never true    Ran Out of Food in the Last Year: Never true  Transportation Needs: No Transportation Needs (12/06/2022)   Received from [facility], [facility]   [facility] - Transportation    In the past 12 months, has lack of transportation kept you from medical appointments or from getting medications?: No    Lack of Transportation (Non-Medical): No  Physical Activity: Not on file  Stress: Not on file  Social Connections: Not on file  Intimate Partner Violence: Not on file    Review of Systems: See HPI, otherwise negative ROS  Physical Exam: BP 110/69    Pulse 66   Temp 97.8 F (36.6 C) (Temporal)   Resp 13   Ht 5' (1.524 m)   Wt 77.7 kg   SpO2 99%   BMI 33.44 kg/m  General:   Alert,  pleasant and cooperative in NAD Head:  Normocephalic and atraumatic. Lungs:  Clear to auscultation.    Heart:  Regular rate and rhythm.   Impression/Plan: Regina Frazier is here for ophthalmic surgery.  Risks, benefits, limitations, and alternatives regarding ophthalmic surgery have been reviewed with the patient.  Questions have been answered.  All parties agreeable.   Lockie Mola, MD  11/21/2023, 9:16 AM

## 2023-11-21 NOTE — Anesthesia Postprocedure Evaluation (Signed)
Anesthesia Post Note  Patient: Regina Frazier  Procedure(s) Performed: CATARACT EXTRACTION PHACO AND INTRAOCULAR LENS PLACEMENT (IOC) LEFT 5.63, 00:28.3 (Left)  Patient location during evaluation: PACU Anesthesia Type: MAC Level of consciousness: awake and alert Pain management: pain level controlled Vital Signs Assessment: post-procedure vital signs reviewed and stable Respiratory status: spontaneous breathing, nonlabored ventilation, respiratory function stable and patient connected to nasal cannula oxygen Cardiovascular status: stable and blood pressure returned to baseline Postop Assessment: no apparent nausea or vomiting Anesthetic complications: no   No notable events documented.   Last Vitals:  Vitals:   11/21/23 1003 11/21/23 1008  BP: 106/66   Pulse:    Resp: 13 14  Temp: 36.8 C   SpO2: 100% 100%    Last Pain:  Vitals:   11/21/23 1008  TempSrc:   PainSc: 0-No pain                 Calvin Jablonowski C Jermya Dowding

## 2023-11-21 NOTE — Anesthesia Preprocedure Evaluation (Addendum)
Anesthesia Evaluation  Patient identified by MRN, date of birth, ID band Patient awake    Reviewed: Allergy & Precautions, H&P , NPO status , Patient's Chart, lab work & pertinent test results  Airway Mallampati: II  TM Distance: >3 FB Neck ROM: Full    Dental no notable dental hx.    Pulmonary neg pulmonary ROS, sleep apnea , former smoker   Pulmonary exam normal breath sounds clear to auscultation       Cardiovascular hypertension, negative cardio ROS Normal cardiovascular exam Rhythm:Regular Rate:Normal     Neuro/Psych negative neurological ROS  negative psych ROS   GI/Hepatic negative GI ROS, Neg liver ROS,,,  Endo/Other  negative endocrine ROS    Renal/GU negative Renal ROS  negative genitourinary   Musculoskeletal negative musculoskeletal ROS (+)    Abdominal   Peds negative pediatric ROS (+)  Hematology negative hematology ROS (+)   Anesthesia Other Findings   Raynaud's syndrome  Lupus erythematosus Gout Hypertension Pre-diabetes  Previous cataract surgery 11-07-23      Reproductive/Obstetrics negative OB ROS                             Anesthesia Physical Anesthesia Plan  ASA: 3  Anesthesia Plan: MAC   Post-op Pain Management:    Induction: Intravenous  PONV Risk Score and Plan:   Airway Management Planned: Natural Airway and Nasal Cannula  Additional Equipment:   Intra-op Plan:   Post-operative Plan:   Informed Consent: I have reviewed the patients History and Physical, chart, labs and discussed the procedure including the risks, benefits and alternatives for the proposed anesthesia with the patient or authorized representative who has indicated his/her understanding and acceptance.     Dental Advisory Given  Plan Discussed with: Anesthesiologist, CRNA and Surgeon  Anesthesia Plan Comments: (Patient consented for risks of anesthesia including but not  limited to:  - adverse reactions to medications - damage to eyes, teeth, lips or other oral mucosa - nerve damage due to positioning  - sore throat or hoarseness - Damage to heart, brain, nerves, lungs, other parts of body or loss of life  Patient voiced understanding and assent.)        Anesthesia Quick Evaluation

## 2023-11-21 NOTE — Op Note (Signed)
OPERATIVE NOTE  Regina Frazier [id number] 11/21/2023   PREOPERATIVE DIAGNOSIS:  Nuclear sclerotic cataract left eye. H25.12   POSTOPERATIVE DIAGNOSIS:    Nuclear sclerotic cataract left eye.     PROCEDURE:  Phacoemusification with posterior chamber intraocular lens placement of the left eye  Ultrasound time: Procedure(s): CATARACT EXTRACTION PHACO AND INTRAOCULAR LENS PLACEMENT (IOC) LEFT 5.63, 00:28.3 (Left)  LENS:   Implant Name Type Inv. Item Serial No. Manufacturer Lot No. LRB No. Used Action  LENS IOL TECNIS EYHANCE 20.0 - [id number] Intraocular Lens LENS IOL TECNIS EYHANCE 20.0 [id number] [employer]  Left 1 Implanted      SURGEON:  Deirdre Evener, MD   ANESTHESIA:  Topical with tetracaine drops and 2% Xylocaine jelly, augmented with 1% preservative-free intracameral lidocaine.    COMPLICATIONS:  None.   DESCRIPTION OF PROCEDURE:  The patient was identified in the holding room and transported to the operating room and placed in the supine position under the operating microscope.  The left eye was identified as the operative eye and it was prepped and draped in the usual sterile ophthalmic fashion.   A 1 millimeter clear-corneal paracentesis was made at the 1:30 position.  0.5 ml of preservative-free 1% lidocaine was injected into the anterior chamber.  The anterior chamber was filled with Viscoat viscoelastic.  A 2.4 millimeter keratome was used to make a near-clear corneal incision at the 10:30 position.  .  A curvilinear capsulorrhexis was made with a cystotome and capsulorrhexis forceps.  Balanced salt solution was used to hydrodissect and hydrodelineate the nucleus.   Phacoemulsification was then used in stop and chop fashion to remove the lens nucleus and epinucleus.  The remaining cortex was then removed using the irrigation and aspiration handpiece. Provisc was then placed into the capsular bag to distend it for lens placement.  A lens was then injected into the  capsular bag.  The remaining viscoelastic was aspirated.   Wounds were hydrated with balanced salt solution.  The anterior chamber was inflated to a physiologic pressure with balanced salt solution.  No wound leaks were noted. Vigamox 0.2 ml of a 1mg  per ml solution was injected into the anterior chamber for a dose of 0.2 mg of intracameral antibiotic at the completion of the case.   Timolol and Brimonidine drops were applied to the eye.  The patient was taken to the recovery room in stable condition without complications of anesthesia or surgery.  [name] 11/21/2023, 10:00 AM

## 2023-11-22 ENCOUNTER — Encounter: Payer: Self-pay | Admitting: Ophthalmology

## 2024-01-02 ENCOUNTER — Other Ambulatory Visit: Payer: Self-pay | Admitting: Infectious Diseases

## 2024-01-02 DIAGNOSIS — Z1231 Encounter for screening mammogram for malignant neoplasm of breast: Secondary | ICD-10-CM

## 2024-02-06 ENCOUNTER — Encounter

## 2024-02-13 ENCOUNTER — Ambulatory Visit
Admission: RE | Admit: 2024-02-13 | Discharge: 2024-02-13 | Disposition: A | Discharge status: Home / Self Care | Source: Ambulatory Visit | Attending: Infectious Diseases | Admitting: Infectious Diseases

## 2024-02-13 DIAGNOSIS — Z1231 Encounter for screening mammogram for malignant neoplasm of breast: Secondary | ICD-10-CM | POA: Insufficient documentation

## 2024-05-20 ENCOUNTER — Telehealth: Payer: Self-pay | Admitting: Family Medicine

## 2024-05-20 NOTE — Telephone Encounter (Signed)
 Attempted to reach client by phone regarding questions related to COVID-19 vaccination and recent history of having COVID-19 illness.   Call went to identified vm.  Message left with client to call [phone] to speak with the immunization coordinator. .me
